# Patient Record
Sex: Female | Born: 1960 | Race: White | Hispanic: No | Marital: Married | Smoking: Never smoker
Health system: Southern US, Community
[De-identification: ages and names within clinical notes are randomized; demographics above are authoritative.]

## PROBLEM LIST (undated history)

## (undated) DIAGNOSIS — E079 Disorder of thyroid, unspecified: Secondary | ICD-10-CM

---

## 1998-01-17 ENCOUNTER — Ambulatory Visit (HOSPITAL_COMMUNITY): Admission: RE | Admit: 1998-01-17 | Discharge: 1998-01-17 | Payer: Self-pay | Admitting: Obstetrics and Gynecology

## 2001-11-26 ENCOUNTER — Other Ambulatory Visit: Admission: RE | Admit: 2001-11-26 | Discharge: 2001-11-26 | Payer: Self-pay | Admitting: *Deleted

## 2002-10-14 ENCOUNTER — Emergency Department (HOSPITAL_COMMUNITY): Admission: EM | Admit: 2002-10-14 | Discharge: 2002-10-14 | Payer: Self-pay | Admitting: Emergency Medicine

## 2002-10-14 ENCOUNTER — Encounter: Payer: Self-pay | Admitting: Emergency Medicine

## 2003-08-20 ENCOUNTER — Other Ambulatory Visit: Admission: RE | Admit: 2003-08-20 | Discharge: 2003-08-20 | Payer: Self-pay | Admitting: *Deleted

## 2004-10-09 ENCOUNTER — Emergency Department (HOSPITAL_COMMUNITY): Admission: EM | Admit: 2004-10-09 | Discharge: 2004-10-09 | Payer: Self-pay | Admitting: Emergency Medicine

## 2004-12-20 ENCOUNTER — Ambulatory Visit: Payer: Self-pay

## 2006-10-05 ENCOUNTER — Encounter: Admission: RE | Admit: 2006-10-05 | Discharge: 2006-10-05 | Payer: Self-pay | Admitting: Obstetrics and Gynecology

## 2010-07-09 ENCOUNTER — Encounter: Admission: RE | Admit: 2010-07-09 | Discharge: 2010-07-09 | Payer: Self-pay | Admitting: Obstetrics and Gynecology

## 2011-06-24 ENCOUNTER — Other Ambulatory Visit (HOSPITAL_COMMUNITY): Payer: Self-pay | Admitting: Obstetrics and Gynecology

## 2011-06-24 ENCOUNTER — Ambulatory Visit (HOSPITAL_COMMUNITY)
Admission: RE | Admit: 2011-06-24 | Discharge: 2011-06-24 | Disposition: A | Discharge status: Home / Self Care | Payer: BC Managed Care – PPO | Source: Ambulatory Visit | Attending: Obstetrics and Gynecology | Admitting: Obstetrics and Gynecology

## 2011-06-24 DIAGNOSIS — N83209 Unspecified ovarian cyst, unspecified side: Secondary | ICD-10-CM | POA: Insufficient documentation

## 2011-06-24 DIAGNOSIS — R1032 Left lower quadrant pain: Secondary | ICD-10-CM

## 2011-06-24 DIAGNOSIS — R109 Unspecified abdominal pain: Secondary | ICD-10-CM | POA: Insufficient documentation

## 2012-05-27 ENCOUNTER — Ambulatory Visit: Payer: BC Managed Care – PPO

## 2012-05-27 ENCOUNTER — Ambulatory Visit (INDEPENDENT_AMBULATORY_CARE_PROVIDER_SITE_OTHER): Payer: BC Managed Care – PPO | Admitting: Family Medicine

## 2012-05-27 VITALS — BP 152/92 | HR 96 | Temp 98.8°F | Resp 16 | Ht 64.0 in | Wt 207.0 lb

## 2012-05-27 DIAGNOSIS — S39012A Strain of muscle, fascia and tendon of lower back, initial encounter: Secondary | ICD-10-CM

## 2012-05-27 DIAGNOSIS — M549 Dorsalgia, unspecified: Secondary | ICD-10-CM

## 2012-05-27 DIAGNOSIS — R8281 Pyuria: Secondary | ICD-10-CM

## 2012-05-27 DIAGNOSIS — IMO0002 Reserved for concepts with insufficient information to code with codable children: Secondary | ICD-10-CM

## 2012-05-27 DIAGNOSIS — R82998 Other abnormal findings in urine: Secondary | ICD-10-CM

## 2012-05-27 DIAGNOSIS — M62838 Other muscle spasm: Secondary | ICD-10-CM

## 2012-05-27 LAB — POCT URINALYSIS DIPSTICK
Bilirubin, UA: NEGATIVE
Glucose, UA: NEGATIVE
Ketones, UA: NEGATIVE
Nitrite, UA: NEGATIVE
pH, UA: 6

## 2012-05-27 LAB — POCT UA - MICROSCOPIC ONLY
RBC, urine, microscopic: NEGATIVE
Yeast, UA: NEGATIVE

## 2012-05-27 MED ORDER — CYCLOBENZAPRINE HCL 5 MG PO TABS: ORAL_TABLET | ORAL | Status: AC

## 2012-05-27 MED ORDER — MELOXICAM 7.5 MG PO TABS: 7.5000 mg | ORAL_TABLET | Freq: Every day | ORAL | Status: AC

## 2012-05-27 MED ORDER — HYDROCODONE-ACETAMINOPHEN 5-325 MG PO TABS: 1.0000 | ORAL_TABLET | Freq: Four times a day (QID) | ORAL | Status: AC | PRN

## 2012-05-27 NOTE — Patient Instructions (Signed)
If not improving in next 7-10 days - recheck. Return to the clinic or go to the nearest emergency room if any of your symptoms worsen or new symptoms occur. Your should receive a call or letter about your lab results within the next week to 10 days.
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
## 2012-05-29 LAB — URINE CULTURE: Colony Count: 40000

## 2012-07-17 ENCOUNTER — Ambulatory Visit (INDEPENDENT_AMBULATORY_CARE_PROVIDER_SITE_OTHER): Payer: BC Managed Care – PPO | Admitting: Emergency Medicine

## 2012-07-17 VITALS — BP 130/75 | HR 86 | Temp 99.3°F | Resp 18 | Ht 64.25 in | Wt 209.0 lb

## 2012-07-17 DIAGNOSIS — H6590 Unspecified nonsuppurative otitis media, unspecified ear: Secondary | ICD-10-CM

## 2012-07-17 MED ORDER — ANTIPYRINE-BENZOCAINE 54-14 MG/ML OT SOLN: 2.0000 [drp] | Freq: Four times a day (QID) | OTIC | Status: DC | PRN

## 2012-07-17 MED ORDER — PSEUDOEPHEDRINE-GUAIFENESIN ER 60-600 MG PO TB12: 1.0000 | ORAL_TABLET | Freq: Two times a day (BID) | ORAL | Status: AC

## 2012-07-17 NOTE — Progress Notes (Signed)
   Date:  07/17/2012   Name:  Linda Pittman   DOB:  25-Nov-1960   MRN:  [id number] Gender: female Age: 51 y.o.  PCP:  No primary provider on file.    Chief Complaint: Otalgia   History of Present Illness:  Linda Pittman is a 51 y.o. pleasant patient who presents with the following:  Ill since last week with pain in left ear. Now has difficulty sleeping due to the pain.  No fever or chills.  Has nasal congestion but no drainage. Has pain in teeth on the left side.  No cough or other complaints.  No improvement with OTC meds.  Preschool teacher.  There is no problem list on file for this patient.   No past medical history on file.  No past surgical history on file.  History  Substance Use Topics  . Smoking status: Never Smoker   . Smokeless tobacco: Not on file  . Alcohol Use: Not on file    No family history on file.  Allergies  Allergen Reactions  . Penicillins Rash    Medication list has been reviewed and updated.  Outpatient Prescriptions Prior to Visit  Medication Sig Dispense Refill  . levothyroxine (SYNTHROID, LEVOTHROID) 200 MCG tablet Take 200 mcg by mouth daily.      . rosuvastatin (CRESTOR) 20 MG tablet Take 20 mg by mouth daily.      . meloxicam (MOBIC) 7.5 MG tablet Take 1 tablet (7.5 mg total) by mouth daily.  30 tablet  0    Review of Systems:  As per HPI, otherwise negative.    Physical Examination: Filed Vitals:   07/17/12 1640  BP: 130/75  Pulse: 86  Temp: 99.3 F (37.4 C)  Resp: 18   Filed Vitals:   07/17/12 1640  Height: 5' 4.25" (1.632 m)  Weight: 209 lb (94.802 kg)   Body mass index is 35.60 kg/(m^2). Ideal Body Weight: Weight in (lb) to have BMI = 25: 146.5   GEN: WDWN, NAD, Non-toxic, A & O x 3 HEENT: Atraumatic, Normocephalic. Neck supple. No masses, No LAD.  Oropharynx negative.  No sinus tenderness Ears and Nose: No external deformity.  TM's normal color left retracted. CV: RRR, No M/G/R. No JVD. No thrill. No  extra heart sounds. PULM: CTA B, no wheezes, crackles, rhonchi. No retractions. No resp. distress. No accessory muscle use. NEURO Normal gait.  PSYCH: Normally interactive. Conversant. Not depressed or anxious appearing.  Calm demeanor.    Assessment and Plan: Serous otitis media Eustachian tube dysfunction. mucinex d Follow up as needed Dot Lanes, Tessa Lerner, MD I have reviewed and agree with documentation. Robert P. Merla Riches, M.D.

## 2012-10-10 ENCOUNTER — Other Ambulatory Visit: Payer: Self-pay | Admitting: Obstetrics and Gynecology

## 2012-10-10 DIAGNOSIS — Z1231 Encounter for screening mammogram for malignant neoplasm of breast: Secondary | ICD-10-CM

## 2012-11-09 ENCOUNTER — Ambulatory Visit
Admission: RE | Admit: 2012-11-09 | Discharge: 2012-11-09 | Disposition: A | Discharge status: Home / Self Care | Payer: BC Managed Care – PPO | Source: Ambulatory Visit | Attending: Obstetrics and Gynecology | Admitting: Obstetrics and Gynecology

## 2012-11-09 DIAGNOSIS — Z1231 Encounter for screening mammogram for malignant neoplasm of breast: Secondary | ICD-10-CM

## 2012-11-15 ENCOUNTER — Other Ambulatory Visit: Payer: Self-pay | Admitting: Obstetrics and Gynecology

## 2012-11-15 DIAGNOSIS — R928 Other abnormal and inconclusive findings on diagnostic imaging of breast: Secondary | ICD-10-CM

## 2012-11-26 ENCOUNTER — Ambulatory Visit
Admission: RE | Admit: 2012-11-26 | Discharge: 2012-11-26 | Disposition: A | Discharge status: Home / Self Care | Payer: BC Managed Care – PPO | Source: Ambulatory Visit | Attending: Obstetrics and Gynecology | Admitting: Obstetrics and Gynecology

## 2012-11-26 DIAGNOSIS — R928 Other abnormal and inconclusive findings on diagnostic imaging of breast: Secondary | ICD-10-CM

## 2013-04-22 ENCOUNTER — Other Ambulatory Visit: Payer: Self-pay | Admitting: Obstetrics and Gynecology

## 2013-04-22 DIAGNOSIS — R921 Mammographic calcification found on diagnostic imaging of breast: Secondary | ICD-10-CM

## 2013-06-10 ENCOUNTER — Ambulatory Visit
Admission: RE | Admit: 2013-06-10 | Discharge: 2013-06-10 | Disposition: A | Discharge status: Home / Self Care | Payer: BC Managed Care – PPO | Source: Ambulatory Visit | Attending: Obstetrics and Gynecology | Admitting: Obstetrics and Gynecology

## 2013-06-10 DIAGNOSIS — R921 Mammographic calcification found on diagnostic imaging of breast: Secondary | ICD-10-CM

## 2013-11-27 ENCOUNTER — Other Ambulatory Visit: Payer: Self-pay | Admitting: Obstetrics and Gynecology

## 2013-11-27 DIAGNOSIS — R921 Mammographic calcification found on diagnostic imaging of breast: Secondary | ICD-10-CM

## 2013-12-12 ENCOUNTER — Ambulatory Visit
Admission: RE | Admit: 2013-12-12 | Discharge: 2013-12-12 | Disposition: A | Discharge status: Home / Self Care | Payer: Self-pay | Source: Ambulatory Visit | Attending: Obstetrics and Gynecology | Admitting: Obstetrics and Gynecology

## 2013-12-12 DIAGNOSIS — R921 Mammographic calcification found on diagnostic imaging of breast: Secondary | ICD-10-CM

## 2014-12-05 ENCOUNTER — Other Ambulatory Visit: Payer: Self-pay

## 2014-12-05 DIAGNOSIS — Z1231 Encounter for screening mammogram for malignant neoplasm of breast: Secondary | ICD-10-CM

## 2014-12-15 ENCOUNTER — Ambulatory Visit: Payer: Self-pay

## 2015-01-05 ENCOUNTER — Ambulatory Visit
Admission: RE | Admit: 2015-01-05 | Discharge: 2015-01-05 | Disposition: A | Discharge status: Home / Self Care | Payer: BLUE CROSS/BLUE SHIELD | Source: Ambulatory Visit

## 2015-01-05 DIAGNOSIS — Z1231 Encounter for screening mammogram for malignant neoplasm of breast: Secondary | ICD-10-CM

## 2016-03-16 ENCOUNTER — Other Ambulatory Visit: Payer: Self-pay

## 2016-03-16 DIAGNOSIS — Z1231 Encounter for screening mammogram for malignant neoplasm of breast: Secondary | ICD-10-CM

## 2016-03-25 ENCOUNTER — Ambulatory Visit: Payer: BLUE CROSS/BLUE SHIELD

## 2016-04-08 ENCOUNTER — Ambulatory Visit: Payer: BLUE CROSS/BLUE SHIELD

## 2016-04-14 ENCOUNTER — Ambulatory Visit
Admission: RE | Admit: 2016-04-14 | Discharge: 2016-04-14 | Disposition: A | Discharge status: Home / Self Care | Payer: BLUE CROSS/BLUE SHIELD | Source: Ambulatory Visit

## 2016-04-14 DIAGNOSIS — Z1231 Encounter for screening mammogram for malignant neoplasm of breast: Secondary | ICD-10-CM

## 2016-04-25 DIAGNOSIS — E784 Other hyperlipidemia: Secondary | ICD-10-CM | POA: Diagnosis not present

## 2016-04-25 DIAGNOSIS — Z1389 Encounter for screening for other disorder: Secondary | ICD-10-CM | POA: Diagnosis not present

## 2016-04-25 DIAGNOSIS — E038 Other specified hypothyroidism: Secondary | ICD-10-CM | POA: Diagnosis not present

## 2016-04-25 DIAGNOSIS — K219 Gastro-esophageal reflux disease without esophagitis: Secondary | ICD-10-CM | POA: Diagnosis not present

## 2016-04-25 DIAGNOSIS — R7302 Impaired glucose tolerance (oral): Secondary | ICD-10-CM | POA: Diagnosis not present

## 2016-07-25 ENCOUNTER — Emergency Department (HOSPITAL_COMMUNITY)
Admission: EM | Admit: 2016-07-25 | Discharge: 2016-07-25 | Disposition: A | Discharge status: Home / Self Care | Payer: BLUE CROSS/BLUE SHIELD | Attending: Emergency Medicine | Admitting: Emergency Medicine

## 2016-07-25 ENCOUNTER — Encounter (HOSPITAL_COMMUNITY): Payer: Self-pay | Admitting: Emergency Medicine

## 2016-07-25 ENCOUNTER — Emergency Department (HOSPITAL_COMMUNITY): Payer: BLUE CROSS/BLUE SHIELD

## 2016-07-25 DIAGNOSIS — R079 Chest pain, unspecified: Secondary | ICD-10-CM | POA: Insufficient documentation

## 2016-07-25 DIAGNOSIS — Z79899 Other long term (current) drug therapy: Secondary | ICD-10-CM | POA: Insufficient documentation

## 2016-07-25 DIAGNOSIS — R0789 Other chest pain: Secondary | ICD-10-CM | POA: Diagnosis not present

## 2016-07-25 HISTORY — DX: Disorder of thyroid, unspecified: E07.9

## 2016-07-25 LAB — I-STAT TROPONIN, ED
TROPONIN I, POC: 0 ng/mL (ref 0.00–0.08)
Troponin i, poc: 0 ng/mL (ref 0.00–0.08)

## 2016-07-25 LAB — CBC
HCT: 36.4 % (ref 36.0–46.0)
Hemoglobin: 12.3 g/dL (ref 12.0–15.0)
MCH: 31.1 pg (ref 26.0–34.0)
MCHC: 33.8 g/dL (ref 30.0–36.0)
MCV: 92.2 fL (ref 78.0–100.0)
PLATELETS: 229 10*3/uL (ref 150–400)
RBC: 3.95 MIL/uL (ref 3.87–5.11)
RDW: 12.8 % (ref 11.5–15.5)
WBC: 6.3 10*3/uL (ref 4.0–10.5)

## 2016-07-25 LAB — BASIC METABOLIC PANEL
Anion gap: 10 (ref 5–15)
BUN: 12 mg/dL (ref 6–20)
CHLORIDE: 106 mmol/L (ref 101–111)
CO2: 24 mmol/L (ref 22–32)
CREATININE: 0.64 mg/dL (ref 0.44–1.00)
Calcium: 9.2 mg/dL (ref 8.9–10.3)
GFR calc Af Amer: >60 mL/min (ref >60)
GFR calc non Af Amer: >60 mL/min (ref >60)
Glucose, Bld: 157 mg/dL — ABNORMAL HIGH (ref 65–99)
Potassium: 3.3 mmol/L — ABNORMAL LOW (ref 3.5–5.1)
SODIUM: 140 mmol/L (ref 135–145)

## 2016-07-25 LAB — D-DIMER, QUANTITATIVE (NOT AT ARMC): D-Dimer, Quant: <0.27 ug/mL-FEU (ref 0.00–0.50)

## 2016-07-25 LAB — CBG MONITORING, ED: GLUCOSE-CAPILLARY: 142 mg/dL — AB (ref 65–99)

## 2016-07-25 MED ORDER — ASPIRIN 81 MG PO CHEW
324.0000 mg | CHEWABLE_TABLET | Freq: Once | ORAL | Status: AC
  Administered 2016-07-25: 324 mg via ORAL
  Filled 2016-07-25: qty 4

## 2016-07-25 MED ORDER — CYCLOBENZAPRINE HCL 5 MG PO TABS: 5.0000 mg | ORAL_TABLET | Freq: Two times a day (BID) | ORAL | 0 refills | Status: AC | PRN

## 2016-07-25 MED ORDER — POTASSIUM CHLORIDE CRYS ER 20 MEQ PO TBCR
20.0000 meq | EXTENDED_RELEASE_TABLET | Freq: Once | ORAL | Status: AC
  Administered 2016-07-25: 20 meq via ORAL
  Filled 2016-07-25: qty 1

## 2016-07-25 NOTE — ED Provider Notes (Signed)
[facility]-EMERGENCY DEPT Provider Note   CSN: [id number] Arrival date & time: 07/25/16  1347     History   Chief Complaint Chief Complaint  Patient presents with  . Chest Pain  . Arm Pain    HPI Linda Pittman is a 55 y.o. female with a past medical history significant for thyroid disease who presents with chest pain. Patient reports that she had one episode of chest pain last week that she thought was related to chronic left shoulder muscle pain. She says that it went away after one day last week and ibuprofen use. She says that today, while driving her car, she had sudden onset left-sided chest pain. She says that she also hurt in her left shoulder the same time. She denied diaphoresis, but does report onset of nausea. She also felt lightheaded and fatigued to the point where she pulled over the car to get help. She reports that the chest pain or lasted for several seconds and it was stabbing in nature. She denies any recent fevers, chills, constipation, diarrhea or dysuria.  Of note, patient reports that she is a never smoker but does have a strong family history of heart disease in both parents. She reports her father had a heart attack in his 30s. He denies a history of DVT/PE and denies any recent leg pain or leg swelling.   The history is provided by the patient, the spouse and medical records. No language interpreter was used.  Chest Pain   This is a new problem. The current episode started 1 to 2 hours ago. The problem occurs rarely. The problem has been resolved. The pain is associated with rest. The pain is present in the lateral region. The pain is at a severity of 9/10. The pain is severe. The quality of the pain is described as brief and sharp. The pain radiates to the left shoulder. Duration of episode(s) is 2 seconds. Associated symptoms include nausea. Pertinent negatives include no abdominal pain, no back pain, no claudication, no cough, no diaphoresis, no exertional chest  pressure, no fever, no headaches, no hemoptysis, no irregular heartbeat, no leg pain, no lower extremity edema, no numbness, no palpitations, no shortness of breath, no syncope, no vomiting and no weakness. She has tried nothing for the symptoms. The treatment provided no relief.  Pertinent negatives for past medical history include no CAD, no diabetes, no pacemaker, no PE and no recent injury.    Past Medical History:  Diagnosis Date  . Thyroid disease     There are no active problems to display for this patient.   History reviewed. No pertinent surgical history.  OB History    No data available       Home Medications    Prior to Admission medications   Medication Sig Start Date End Date Taking? Authorizing Provider  ibuprofen (ADVIL,MOTRIN) 200 MG tablet Take 200 mg by mouth every 6 (six) hours as needed for fever, headache, mild pain, moderate pain or cramping.   Yes Historical Provider, MD  levothyroxine (SYNTHROID, LEVOTHROID) 175 MCG tablet Take 175 mcg by mouth daily before breakfast.   Yes Historical Provider, MD  rosuvastatin (CRESTOR) 20 MG tablet Take 20 mg by mouth every Sunday.    Yes Historical Provider, MD    Family History No family history on file.  Social History Social History  Substance Use Topics  . Smoking status: Never Smoker  . Smokeless tobacco: Not on file  . Alcohol use Yes  Comment: occasional      Allergies   Penicillins   Review of Systems Review of Systems  Constitutional: Negative for chills, diaphoresis, fatigue and fever.  HENT: Negative for congestion and rhinorrhea.   Respiratory: Negative for cough, hemoptysis, chest tightness, shortness of breath, wheezing and stridor.   Cardiovascular: Positive for chest pain. Negative for palpitations, claudication and syncope.  Gastrointestinal: Positive for nausea. Negative for abdominal pain, constipation, diarrhea and vomiting.  Genitourinary: Negative for dysuria.  Musculoskeletal:  Negative for back pain, neck pain and neck stiffness.  Skin: Negative for rash and wound.  Neurological: Negative for facial asymmetry, weakness, numbness and headaches.  Psychiatric/Behavioral: Negative for confusion.  All other systems reviewed and are negative.    Physical Exam Updated Vital Signs BP 135/82   Pulse 64   Temp 98.4 F (36.9 C) (Oral)   Resp 13   Ht 5' 4.25" (1.632 m)   Wt 195 lb (88.5 kg)   LMP 06/30/2016   SpO2 95%   BMI 33.21 kg/m   Physical Exam  Constitutional: She appears well-developed and well-nourished. No distress.  HENT:  Head: Normocephalic and atraumatic.  Nose: Nose normal.  Mouth/Throat: Oropharynx is clear and moist. No oropharyngeal exudate.  Eyes: Conjunctivae and EOM are normal. Pupils are equal, round, and reactive to light.  Neck: Normal range of motion. Neck supple.  Cardiovascular: Normal rate and regular rhythm.   No murmur heard. Pulmonary/Chest: Effort normal and breath sounds normal. No respiratory distress. She has no wheezes. She has no rales. She exhibits tenderness.    Abdominal: Soft. There is no tenderness.  Musculoskeletal: She exhibits no edema or tenderness.  Neurological: She is alert. She exhibits normal muscle tone.  Skin: Skin is warm and dry.  Psychiatric: She has a normal mood and affect.  Nursing note and vitals reviewed.    ED Treatments / Results  Labs (all labs ordered are listed, but only abnormal results are displayed) Labs Reviewed  BASIC METABOLIC PANEL - Abnormal; Notable for the following:       Result Value   Potassium 3.3 (*)    Glucose, Bld 157 (*)    All other components within normal limits  CBG MONITORING, ED - Abnormal; Notable for the following:    Glucose-Capillary 142 (*)    All other components within normal limits  CBC  D-DIMER, QUANTITATIVE (NOT [facility])  Rosezena SensorI-STAT TROPOININ, ED  I-STAT TROPOININ, ED    EKG  EKG Interpretation  Date/Time:  Monday July 25 2016 13:55:19  EDT Ventricular Rate:  77 PR Interval:    QRS Duration: 100 QT Interval:  398 QTC Calculation: 451 R Axis:   48 Text Interpretation:  Sinus rhythm Low voltage, precordial leads Baseline wander in lead(s) V1 NO prior EKG present. No evideice of Ischemia.  Confirmed by Rush LandmarkEGELER MD, Howell Groesbeck [phone]) on 07/25/2016 4:42:21 PM       Radiology Dg Chest 2 View  Result Date: 07/25/2016 CLINICAL DATA:  Patient with sharp shooting left-sided chest pain radiating to the left arm. Initial encounter. EXAM: CHEST  2 VIEW COMPARISON:  None. FINDINGS: Monitoring leads overlie the patient. Normal cardiac and mediastinal contours. Minimal heterogeneous opacities left lower lung. No pleural effusion or pneumothorax. IMPRESSION: Probable atelectasis left lower lung. Electronically Signed   By: Annia Beltrew  Davis M.D.   On: 07/25/2016 15:02    Procedures Procedures (including critical care time)  Medications Ordered in ED Medications  aspirin chewable tablet 324 mg (324 mg Oral Given 07/25/16 1654)  potassium chloride SA (K-DUR,KLOR-CON) CR tablet 20 mEq (20 mEq Oral Given 07/25/16 1758)     Initial Impression / Assessment and Plan / ED Course  I have reviewed the triage vital signs and the nursing notes.  Pertinent labs & imaging results that were available during my care of the patient were reviewed by me and considered in my medical decision making (see chart for details).  Clinical Course   Linda Pittman is a 55 y.o. female with a past medical history significant for thyroid disease who presents with chest pain. History and exam are seen above. Patients EKG showed no acute ischemia. HEAR score calculated as a two. Based on description of symptoms, suspect musculoskeletal chest pain. Patient given Aspirin during workup.  Results Of the workup are seen above. Given patient's age, she was not PERCable. D dimer negative. Troponin negative times two. Potassium low at 3.3, this was supplemented. No evidence  of infection on laboratory testing, and electrolytes not otherwise abnormal. Chest x-ray showed atelectasis but no concern for pneumonia or other significant abnormality.  Patient did not have any other pain while in the emergency department. Suspect patient's ongoing left shoulder and left musculoskeletal pain contributed to her left chest wall pain today. Similar chest discomfort was reproduced with strong palpation of chest wall.. Patient given instructions to follow up with PCP for further management. Patient requested muscle relaxant due to concern for muscle spasm in shoulder. This was provided. Patient given strict return precautions were any return of symptoms. Patient understood this and plans to follow. Patient had no other questions or concerns and patient discharged in good condition.    Final Clinical Impressions(s) / ED Diagnoses   Final diagnoses:  Chest pain, unspecified chest pain type    New Prescriptions Discharge Medication List as of 07/25/2016  6:34 PM    START taking these medications   Details  cyclobenzaprine (FLEXERIL) 5 MG tablet Take 1 tablet (5 mg total) by mouth 2 (two) times daily as needed for muscle spasms., Starting Mon 07/25/2016, Print        Clinical Impression: 1. Chest pain, unspecified chest pain type     Disposition: Discharge  Condition: Good  I have discussed the results, Dx and Tx plan with the pt(& family if present). He/she/they expressed understanding and agree(s) with the plan. Discharge instructions discussed at great length. Strict return precautions discussed and pt &/or family have verbalized understanding of the instructions. No further questions at time of discharge.    Discharge Medication List as of 07/25/2016  6:34 PM    START taking these medications   Details  cyclobenzaprine (FLEXERIL) 5 MG tablet Take 1 tablet (5 mg total) by mouth 2 (two) times daily as needed for muscle spasms., Starting Mon 07/25/2016, Print         Follow Up: Adrian Prince, MD [address] [place] [address] [phone]  Schedule an appointment as soon as possible for a visit  If symptoms worsen, please return to the nearest ED.     Canary Brim Robyn Galati, MD 07/26/16 [phone]

## 2016-07-25 NOTE — Progress Notes (Signed)
Patient confirms her pcp is Dr. Adrian PrinceStephen South.  System updated.

## 2016-07-25 NOTE — ED Notes (Signed)
Patient transported to X-ray 

## 2016-07-25 NOTE — ED Notes (Signed)
Discharge instructions, follow up care, and rx x1 reviewed with patient. Patient verbalized understanding. 
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
## 2017-01-20 DIAGNOSIS — H524 Presbyopia: Secondary | ICD-10-CM | POA: Diagnosis not present

## 2017-01-20 DIAGNOSIS — H5203 Hypermetropia, bilateral: Secondary | ICD-10-CM | POA: Diagnosis not present

## 2017-04-25 DIAGNOSIS — K219 Gastro-esophageal reflux disease without esophagitis: Secondary | ICD-10-CM | POA: Diagnosis not present

## 2017-04-25 DIAGNOSIS — E038 Other specified hypothyroidism: Secondary | ICD-10-CM | POA: Diagnosis not present

## 2017-04-25 DIAGNOSIS — E784 Other hyperlipidemia: Secondary | ICD-10-CM | POA: Diagnosis not present

## 2017-04-25 DIAGNOSIS — E119 Type 2 diabetes mellitus without complications: Secondary | ICD-10-CM | POA: Diagnosis not present

## 2017-04-25 DIAGNOSIS — Z1389 Encounter for screening for other disorder: Secondary | ICD-10-CM | POA: Diagnosis not present

## 2017-05-11 ENCOUNTER — Other Ambulatory Visit: Payer: Self-pay | Admitting: Obstetrics and Gynecology

## 2017-05-11 DIAGNOSIS — Z1231 Encounter for screening mammogram for malignant neoplasm of breast: Secondary | ICD-10-CM

## 2017-05-23 ENCOUNTER — Ambulatory Visit
Admission: RE | Admit: 2017-05-23 | Discharge: 2017-05-23 | Disposition: A | Discharge status: Home / Self Care | Payer: BLUE CROSS/BLUE SHIELD | Source: Ambulatory Visit | Attending: Obstetrics and Gynecology | Admitting: Obstetrics and Gynecology

## 2017-05-23 DIAGNOSIS — Z1231 Encounter for screening mammogram for malignant neoplasm of breast: Secondary | ICD-10-CM

## 2017-11-01 DIAGNOSIS — E7849 Other hyperlipidemia: Secondary | ICD-10-CM | POA: Diagnosis not present

## 2017-11-01 DIAGNOSIS — E119 Type 2 diabetes mellitus without complications: Secondary | ICD-10-CM | POA: Diagnosis not present

## 2017-11-01 DIAGNOSIS — E038 Other specified hypothyroidism: Secondary | ICD-10-CM | POA: Diagnosis not present

## 2017-11-01 DIAGNOSIS — Z1389 Encounter for screening for other disorder: Secondary | ICD-10-CM | POA: Diagnosis not present

## 2018-03-05 DIAGNOSIS — E119 Type 2 diabetes mellitus without complications: Secondary | ICD-10-CM | POA: Diagnosis not present

## 2018-03-05 DIAGNOSIS — E7849 Other hyperlipidemia: Secondary | ICD-10-CM | POA: Diagnosis not present

## 2018-03-05 DIAGNOSIS — E038 Other specified hypothyroidism: Secondary | ICD-10-CM | POA: Diagnosis not present

## 2018-03-05 DIAGNOSIS — Z1389 Encounter for screening for other disorder: Secondary | ICD-10-CM | POA: Diagnosis not present

## 2018-04-06 DIAGNOSIS — Z124 Encounter for screening for malignant neoplasm of cervix: Secondary | ICD-10-CM | POA: Diagnosis not present

## 2018-04-06 DIAGNOSIS — Z01419 Encounter for gynecological examination (general) (routine) without abnormal findings: Secondary | ICD-10-CM | POA: Diagnosis not present

## 2018-04-06 DIAGNOSIS — Z6833 Body mass index (BMI) 33.0-33.9, adult: Secondary | ICD-10-CM | POA: Diagnosis not present

## 2018-04-06 DIAGNOSIS — Z1231 Encounter for screening mammogram for malignant neoplasm of breast: Secondary | ICD-10-CM | POA: Diagnosis not present

## 2018-04-06 DIAGNOSIS — Z1389 Encounter for screening for other disorder: Secondary | ICD-10-CM | POA: Diagnosis not present

## 2018-06-09 IMAGING — CR DG CHEST 2V
2 series · 2 of 2 positions shown · non-contrast
Comparison: None.

CLINICAL DATA: Patient with sharp shooting left-sided chest pain
radiating to the left arm. Initial encounter.

EXAM:
CHEST  2 VIEW

[w chest pa]
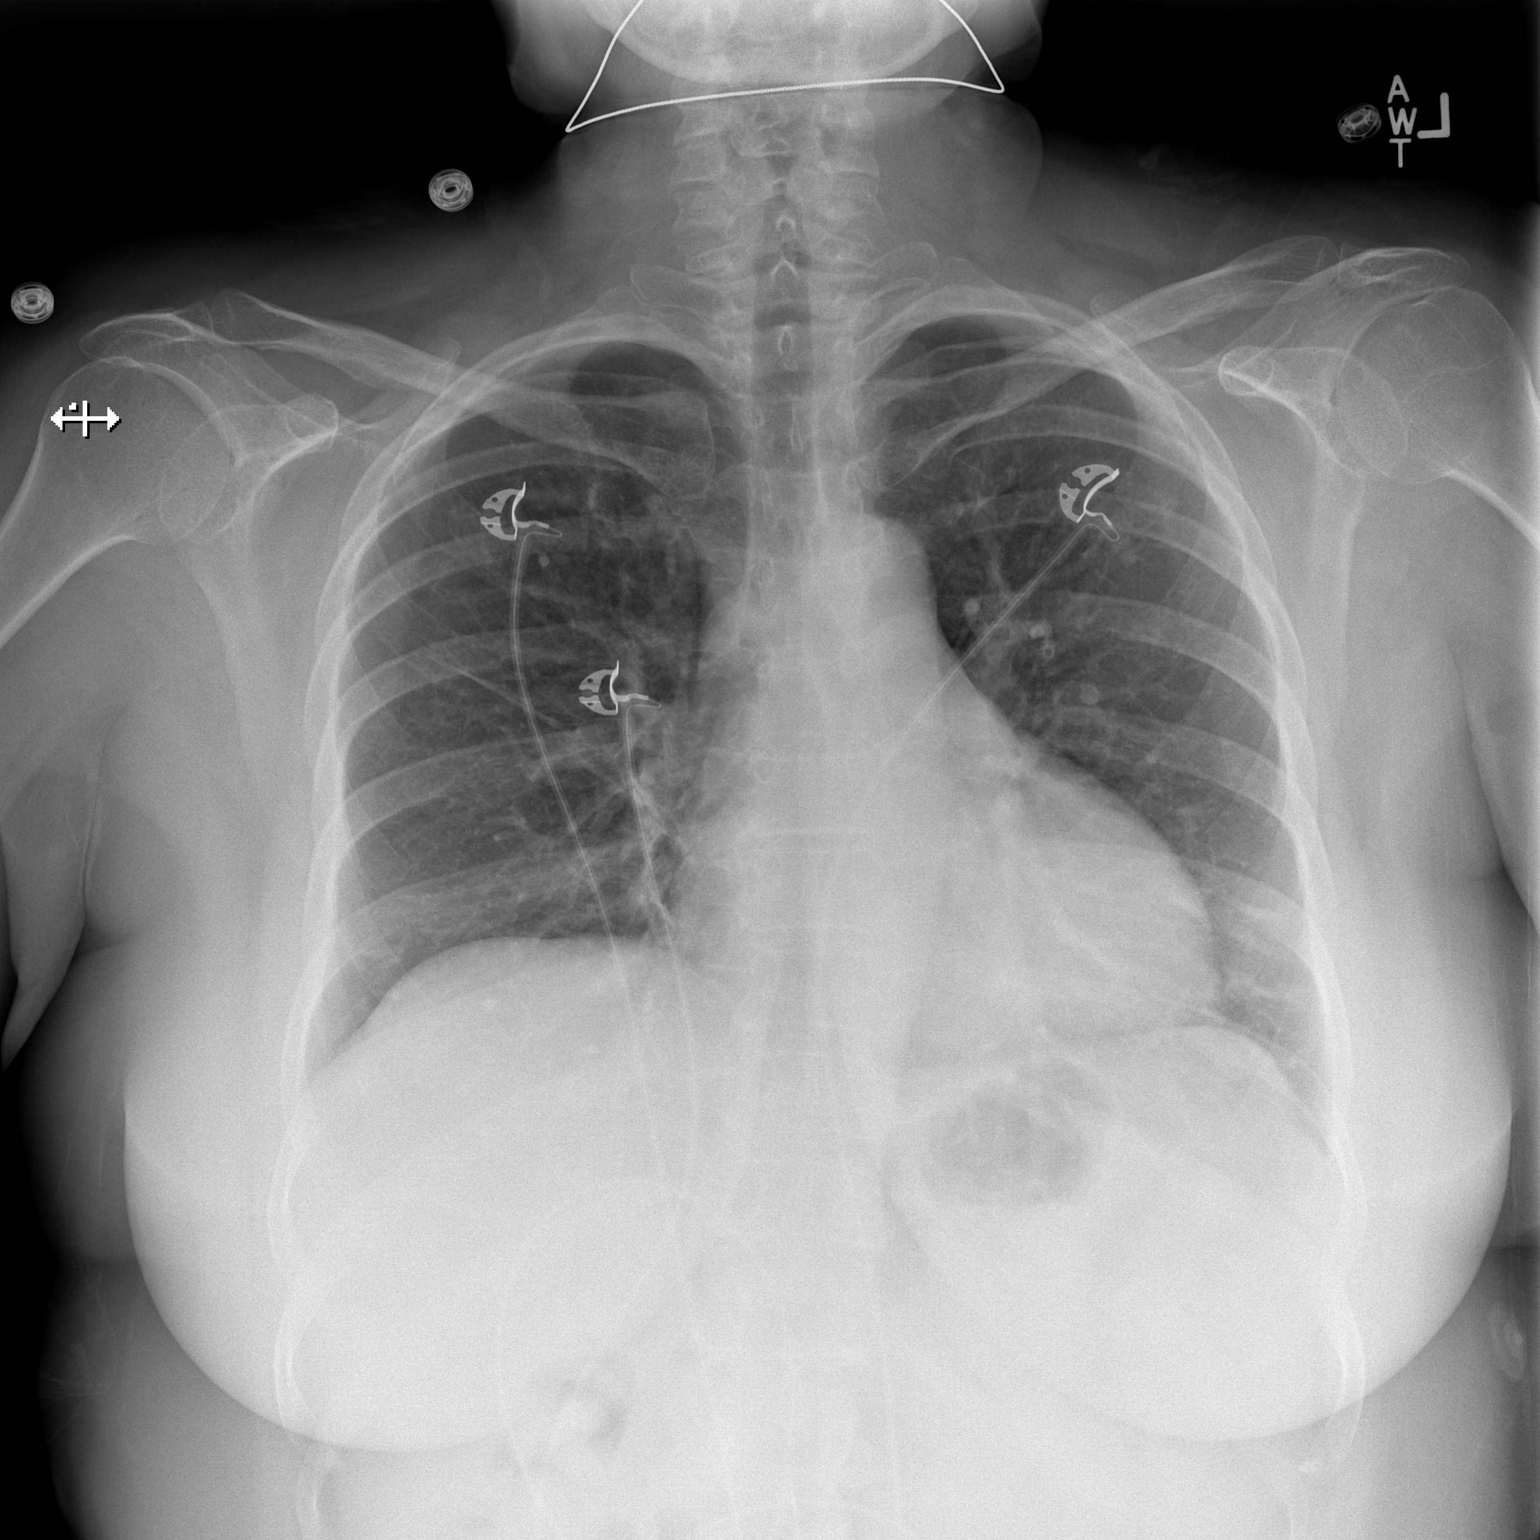

[w chest lat]
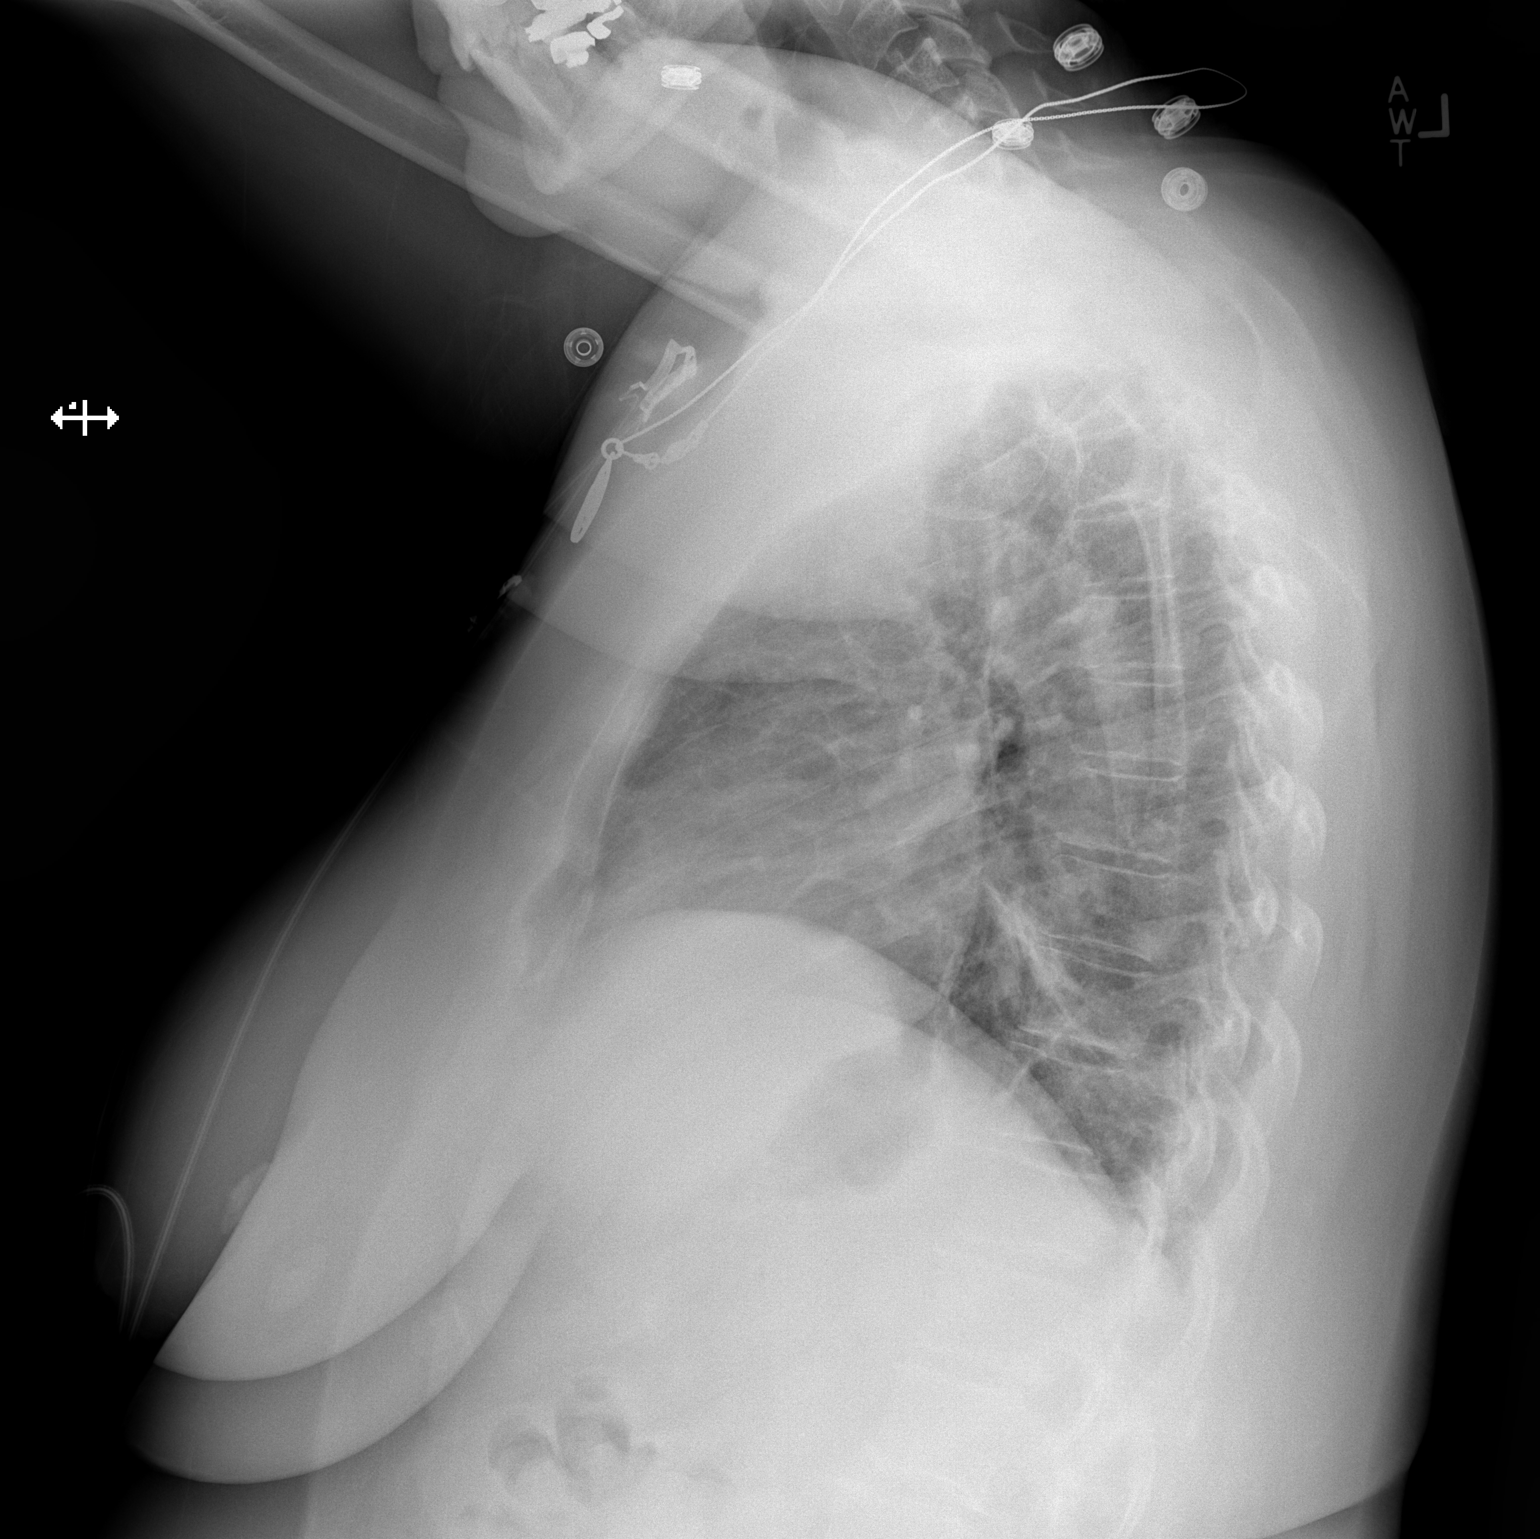

[2 of 2 positions shown; findings below may reference images not displayed]

FINDINGS: Monitoring leads overlie the patient. Normal cardiac and mediastinal
contours. Minimal heterogeneous opacities left lower lung. No
pleural effusion or pneumothorax.
IMPRESSION: Probable atelectasis left lower lung.

## 2018-07-10 DIAGNOSIS — E1169 Type 2 diabetes mellitus with other specified complication: Secondary | ICD-10-CM | POA: Diagnosis not present

## 2018-07-10 DIAGNOSIS — E7849 Other hyperlipidemia: Secondary | ICD-10-CM | POA: Diagnosis not present

## 2018-07-10 DIAGNOSIS — E038 Other specified hypothyroidism: Secondary | ICD-10-CM | POA: Diagnosis not present

## 2018-07-10 DIAGNOSIS — K219 Gastro-esophageal reflux disease without esophagitis: Secondary | ICD-10-CM | POA: Diagnosis not present

## 2018-07-28 DIAGNOSIS — Z23 Encounter for immunization: Secondary | ICD-10-CM | POA: Diagnosis not present

## 2018-08-15 DIAGNOSIS — H5203 Hypermetropia, bilateral: Secondary | ICD-10-CM | POA: Diagnosis not present

## 2018-08-15 DIAGNOSIS — H524 Presbyopia: Secondary | ICD-10-CM | POA: Diagnosis not present

## 2018-08-15 DIAGNOSIS — H04123 Dry eye syndrome of bilateral lacrimal glands: Secondary | ICD-10-CM | POA: Diagnosis not present

## 2018-09-25 DIAGNOSIS — E038 Other specified hypothyroidism: Secondary | ICD-10-CM | POA: Diagnosis not present

## 2018-11-27 DIAGNOSIS — E119 Type 2 diabetes mellitus without complications: Secondary | ICD-10-CM | POA: Diagnosis not present

## 2018-11-27 DIAGNOSIS — E038 Other specified hypothyroidism: Secondary | ICD-10-CM | POA: Diagnosis not present

## 2018-11-27 DIAGNOSIS — E7849 Other hyperlipidemia: Secondary | ICD-10-CM | POA: Diagnosis not present

## 2018-11-27 DIAGNOSIS — E1169 Type 2 diabetes mellitus with other specified complication: Secondary | ICD-10-CM | POA: Diagnosis not present

## 2018-11-27 DIAGNOSIS — K219 Gastro-esophageal reflux disease without esophagitis: Secondary | ICD-10-CM | POA: Diagnosis not present

## 2019-04-15 DIAGNOSIS — Z1231 Encounter for screening mammogram for malignant neoplasm of breast: Secondary | ICD-10-CM | POA: Diagnosis not present

## 2019-04-23 DIAGNOSIS — E1169 Type 2 diabetes mellitus with other specified complication: Secondary | ICD-10-CM | POA: Diagnosis not present

## 2019-04-26 DIAGNOSIS — E1169 Type 2 diabetes mellitus with other specified complication: Secondary | ICD-10-CM | POA: Diagnosis not present

## 2019-04-26 DIAGNOSIS — E669 Obesity, unspecified: Secondary | ICD-10-CM | POA: Diagnosis not present

## 2019-04-26 DIAGNOSIS — E785 Hyperlipidemia, unspecified: Secondary | ICD-10-CM | POA: Diagnosis not present

## 2019-04-26 DIAGNOSIS — E039 Hypothyroidism, unspecified: Secondary | ICD-10-CM | POA: Diagnosis not present

## 2019-08-13 DIAGNOSIS — M79605 Pain in left leg: Secondary | ICD-10-CM | POA: Diagnosis not present

## 2019-08-13 DIAGNOSIS — S8002XA Contusion of left knee, initial encounter: Secondary | ICD-10-CM | POA: Diagnosis not present

## 2019-08-13 DIAGNOSIS — M545 Low back pain: Secondary | ICD-10-CM | POA: Diagnosis not present

## 2019-08-16 DIAGNOSIS — E669 Obesity, unspecified: Secondary | ICD-10-CM | POA: Diagnosis not present

## 2019-08-16 DIAGNOSIS — E785 Hyperlipidemia, unspecified: Secondary | ICD-10-CM | POA: Diagnosis not present

## 2019-08-16 DIAGNOSIS — E039 Hypothyroidism, unspecified: Secondary | ICD-10-CM | POA: Diagnosis not present

## 2019-08-16 DIAGNOSIS — E119 Type 2 diabetes mellitus without complications: Secondary | ICD-10-CM | POA: Diagnosis not present

## 2019-08-19 DIAGNOSIS — M79605 Pain in left leg: Secondary | ICD-10-CM | POA: Diagnosis not present

## 2019-08-19 DIAGNOSIS — M25562 Pain in left knee: Secondary | ICD-10-CM | POA: Diagnosis not present

## 2019-08-19 DIAGNOSIS — M25552 Pain in left hip: Secondary | ICD-10-CM | POA: Diagnosis not present

## 2019-08-20 DIAGNOSIS — E1169 Type 2 diabetes mellitus with other specified complication: Secondary | ICD-10-CM | POA: Diagnosis not present

## 2019-08-20 DIAGNOSIS — E038 Other specified hypothyroidism: Secondary | ICD-10-CM | POA: Diagnosis not present

## 2019-08-20 DIAGNOSIS — E7849 Other hyperlipidemia: Secondary | ICD-10-CM | POA: Diagnosis not present

## 2019-08-20 DIAGNOSIS — Z23 Encounter for immunization: Secondary | ICD-10-CM | POA: Diagnosis not present

## 2019-08-22 DIAGNOSIS — M79605 Pain in left leg: Secondary | ICD-10-CM | POA: Diagnosis not present

## 2019-08-22 DIAGNOSIS — M25552 Pain in left hip: Secondary | ICD-10-CM | POA: Diagnosis not present

## 2019-08-22 DIAGNOSIS — M25562 Pain in left knee: Secondary | ICD-10-CM | POA: Diagnosis not present

## 2019-08-27 DIAGNOSIS — M79605 Pain in left leg: Secondary | ICD-10-CM | POA: Diagnosis not present

## 2019-08-27 DIAGNOSIS — M25552 Pain in left hip: Secondary | ICD-10-CM | POA: Diagnosis not present

## 2019-08-27 DIAGNOSIS — M25562 Pain in left knee: Secondary | ICD-10-CM | POA: Diagnosis not present

## 2019-09-02 DIAGNOSIS — M25562 Pain in left knee: Secondary | ICD-10-CM | POA: Diagnosis not present

## 2019-09-02 DIAGNOSIS — M79605 Pain in left leg: Secondary | ICD-10-CM | POA: Diagnosis not present

## 2019-09-02 DIAGNOSIS — M25552 Pain in left hip: Secondary | ICD-10-CM | POA: Diagnosis not present

## 2019-09-10 DIAGNOSIS — S8002XA Contusion of left knee, initial encounter: Secondary | ICD-10-CM | POA: Diagnosis not present

## 2019-09-10 DIAGNOSIS — M79605 Pain in left leg: Secondary | ICD-10-CM | POA: Diagnosis not present

## 2019-09-10 DIAGNOSIS — M25562 Pain in left knee: Secondary | ICD-10-CM | POA: Diagnosis not present

## 2019-09-10 DIAGNOSIS — M545 Low back pain: Secondary | ICD-10-CM | POA: Diagnosis not present

## 2019-09-21 DIAGNOSIS — S8002XA Contusion of left knee, initial encounter: Secondary | ICD-10-CM | POA: Diagnosis not present

## 2019-09-24 DIAGNOSIS — H524 Presbyopia: Secondary | ICD-10-CM | POA: Diagnosis not present

## 2019-09-24 DIAGNOSIS — R7303 Prediabetes: Secondary | ICD-10-CM | POA: Diagnosis not present

## 2019-09-24 DIAGNOSIS — H2513 Age-related nuclear cataract, bilateral: Secondary | ICD-10-CM | POA: Diagnosis not present

## 2019-09-24 DIAGNOSIS — H5203 Hypermetropia, bilateral: Secondary | ICD-10-CM | POA: Diagnosis not present

## 2019-10-01 DIAGNOSIS — S8002XA Contusion of left knee, initial encounter: Secondary | ICD-10-CM | POA: Diagnosis not present

## 2019-10-07 DIAGNOSIS — M25552 Pain in left hip: Secondary | ICD-10-CM | POA: Diagnosis not present

## 2019-10-07 DIAGNOSIS — M79605 Pain in left leg: Secondary | ICD-10-CM | POA: Diagnosis not present

## 2019-10-07 DIAGNOSIS — M25562 Pain in left knee: Secondary | ICD-10-CM | POA: Diagnosis not present

## 2019-10-09 DIAGNOSIS — M25552 Pain in left hip: Secondary | ICD-10-CM | POA: Diagnosis not present

## 2019-10-09 DIAGNOSIS — M79605 Pain in left leg: Secondary | ICD-10-CM | POA: Diagnosis not present

## 2019-10-09 DIAGNOSIS — M25562 Pain in left knee: Secondary | ICD-10-CM | POA: Diagnosis not present

## 2019-10-14 DIAGNOSIS — M25562 Pain in left knee: Secondary | ICD-10-CM | POA: Diagnosis not present

## 2019-10-14 DIAGNOSIS — M25552 Pain in left hip: Secondary | ICD-10-CM | POA: Diagnosis not present

## 2019-10-14 DIAGNOSIS — M79605 Pain in left leg: Secondary | ICD-10-CM | POA: Diagnosis not present

## 2019-10-16 DIAGNOSIS — M25562 Pain in left knee: Secondary | ICD-10-CM | POA: Diagnosis not present

## 2019-10-16 DIAGNOSIS — M79605 Pain in left leg: Secondary | ICD-10-CM | POA: Diagnosis not present

## 2019-10-16 DIAGNOSIS — M25552 Pain in left hip: Secondary | ICD-10-CM | POA: Diagnosis not present

## 2019-10-17 DIAGNOSIS — M25562 Pain in left knee: Secondary | ICD-10-CM | POA: Diagnosis not present

## 2019-10-17 DIAGNOSIS — M25552 Pain in left hip: Secondary | ICD-10-CM | POA: Diagnosis not present

## 2019-10-22 DIAGNOSIS — M25562 Pain in left knee: Secondary | ICD-10-CM | POA: Diagnosis not present

## 2019-10-22 DIAGNOSIS — M79605 Pain in left leg: Secondary | ICD-10-CM | POA: Diagnosis not present

## 2019-10-22 DIAGNOSIS — M25552 Pain in left hip: Secondary | ICD-10-CM | POA: Diagnosis not present

## 2019-10-23 DIAGNOSIS — Z01419 Encounter for gynecological examination (general) (routine) without abnormal findings: Secondary | ICD-10-CM | POA: Diagnosis not present

## 2019-10-23 DIAGNOSIS — Z6833 Body mass index (BMI) 33.0-33.9, adult: Secondary | ICD-10-CM | POA: Diagnosis not present

## 2019-10-23 DIAGNOSIS — Z1389 Encounter for screening for other disorder: Secondary | ICD-10-CM | POA: Diagnosis not present

## 2019-10-28 DIAGNOSIS — M25552 Pain in left hip: Secondary | ICD-10-CM | POA: Diagnosis not present

## 2019-10-28 DIAGNOSIS — M25562 Pain in left knee: Secondary | ICD-10-CM | POA: Diagnosis not present

## 2019-10-28 DIAGNOSIS — M79605 Pain in left leg: Secondary | ICD-10-CM | POA: Diagnosis not present

## 2019-11-05 DIAGNOSIS — R030 Elevated blood-pressure reading, without diagnosis of hypertension: Secondary | ICD-10-CM | POA: Diagnosis not present

## 2019-11-06 DIAGNOSIS — E119 Type 2 diabetes mellitus without complications: Secondary | ICD-10-CM | POA: Diagnosis not present

## 2019-11-06 DIAGNOSIS — M25552 Pain in left hip: Secondary | ICD-10-CM | POA: Diagnosis not present

## 2019-11-06 DIAGNOSIS — E7849 Other hyperlipidemia: Secondary | ICD-10-CM | POA: Diagnosis not present

## 2019-11-06 DIAGNOSIS — M25562 Pain in left knee: Secondary | ICD-10-CM | POA: Diagnosis not present

## 2019-11-06 DIAGNOSIS — M79605 Pain in left leg: Secondary | ICD-10-CM | POA: Diagnosis not present

## 2019-11-07 DIAGNOSIS — E119 Type 2 diabetes mellitus without complications: Secondary | ICD-10-CM | POA: Diagnosis not present

## 2019-11-12 DIAGNOSIS — S8002XA Contusion of left knee, initial encounter: Secondary | ICD-10-CM | POA: Diagnosis not present

## 2019-11-17 DIAGNOSIS — M25552 Pain in left hip: Secondary | ICD-10-CM | POA: Diagnosis not present

## 2019-11-17 DIAGNOSIS — M25562 Pain in left knee: Secondary | ICD-10-CM | POA: Diagnosis not present

## 2019-11-18 DIAGNOSIS — M25552 Pain in left hip: Secondary | ICD-10-CM | POA: Diagnosis not present

## 2019-11-18 DIAGNOSIS — M79605 Pain in left leg: Secondary | ICD-10-CM | POA: Diagnosis not present

## 2019-11-18 DIAGNOSIS — M25562 Pain in left knee: Secondary | ICD-10-CM | POA: Diagnosis not present

## 2019-11-26 DIAGNOSIS — M545 Low back pain: Secondary | ICD-10-CM | POA: Diagnosis not present

## 2019-11-26 DIAGNOSIS — M25562 Pain in left knee: Secondary | ICD-10-CM | POA: Diagnosis not present

## 2019-12-03 DIAGNOSIS — M545 Low back pain: Secondary | ICD-10-CM | POA: Diagnosis not present

## 2019-12-10 DIAGNOSIS — M545 Low back pain: Secondary | ICD-10-CM | POA: Diagnosis not present

## 2019-12-10 DIAGNOSIS — M79605 Pain in left leg: Secondary | ICD-10-CM | POA: Diagnosis not present

## 2019-12-17 DIAGNOSIS — M5442 Lumbago with sciatica, left side: Secondary | ICD-10-CM | POA: Diagnosis not present

## 2019-12-17 DIAGNOSIS — M25552 Pain in left hip: Secondary | ICD-10-CM | POA: Diagnosis not present

## 2019-12-17 DIAGNOSIS — M25562 Pain in left knee: Secondary | ICD-10-CM | POA: Diagnosis not present

## 2019-12-17 DIAGNOSIS — M9904 Segmental and somatic dysfunction of sacral region: Secondary | ICD-10-CM | POA: Diagnosis not present

## 2019-12-17 DIAGNOSIS — M9903 Segmental and somatic dysfunction of lumbar region: Secondary | ICD-10-CM | POA: Diagnosis not present

## 2019-12-18 DIAGNOSIS — M5442 Lumbago with sciatica, left side: Secondary | ICD-10-CM | POA: Diagnosis not present

## 2019-12-18 DIAGNOSIS — M9903 Segmental and somatic dysfunction of lumbar region: Secondary | ICD-10-CM | POA: Diagnosis not present

## 2019-12-18 DIAGNOSIS — M25552 Pain in left hip: Secondary | ICD-10-CM | POA: Diagnosis not present

## 2019-12-18 DIAGNOSIS — M25562 Pain in left knee: Secondary | ICD-10-CM | POA: Diagnosis not present

## 2019-12-18 DIAGNOSIS — M9904 Segmental and somatic dysfunction of sacral region: Secondary | ICD-10-CM | POA: Diagnosis not present

## 2019-12-24 DIAGNOSIS — M5442 Lumbago with sciatica, left side: Secondary | ICD-10-CM | POA: Diagnosis not present

## 2019-12-24 DIAGNOSIS — M9904 Segmental and somatic dysfunction of sacral region: Secondary | ICD-10-CM | POA: Diagnosis not present

## 2019-12-24 DIAGNOSIS — M9903 Segmental and somatic dysfunction of lumbar region: Secondary | ICD-10-CM | POA: Diagnosis not present

## 2019-12-24 DIAGNOSIS — M25562 Pain in left knee: Secondary | ICD-10-CM | POA: Diagnosis not present

## 2019-12-26 DIAGNOSIS — M9903 Segmental and somatic dysfunction of lumbar region: Secondary | ICD-10-CM | POA: Diagnosis not present

## 2019-12-26 DIAGNOSIS — M9904 Segmental and somatic dysfunction of sacral region: Secondary | ICD-10-CM | POA: Diagnosis not present

## 2019-12-26 DIAGNOSIS — M25562 Pain in left knee: Secondary | ICD-10-CM | POA: Diagnosis not present

## 2019-12-26 DIAGNOSIS — M5442 Lumbago with sciatica, left side: Secondary | ICD-10-CM | POA: Diagnosis not present

## 2019-12-31 DIAGNOSIS — S8002XD Contusion of left knee, subsequent encounter: Secondary | ICD-10-CM | POA: Diagnosis not present

## 2020-01-02 DIAGNOSIS — M25562 Pain in left knee: Secondary | ICD-10-CM | POA: Diagnosis not present

## 2020-01-02 DIAGNOSIS — M9903 Segmental and somatic dysfunction of lumbar region: Secondary | ICD-10-CM | POA: Diagnosis not present

## 2020-01-02 DIAGNOSIS — M5442 Lumbago with sciatica, left side: Secondary | ICD-10-CM | POA: Diagnosis not present

## 2020-01-02 DIAGNOSIS — M9904 Segmental and somatic dysfunction of sacral region: Secondary | ICD-10-CM | POA: Diagnosis not present

## 2020-01-09 DIAGNOSIS — M9903 Segmental and somatic dysfunction of lumbar region: Secondary | ICD-10-CM | POA: Diagnosis not present

## 2020-01-09 DIAGNOSIS — M5442 Lumbago with sciatica, left side: Secondary | ICD-10-CM | POA: Diagnosis not present

## 2020-01-09 DIAGNOSIS — M25562 Pain in left knee: Secondary | ICD-10-CM | POA: Diagnosis not present

## 2020-01-09 DIAGNOSIS — M9904 Segmental and somatic dysfunction of sacral region: Secondary | ICD-10-CM | POA: Diagnosis not present

## 2020-01-14 DIAGNOSIS — M25552 Pain in left hip: Secondary | ICD-10-CM | POA: Diagnosis not present

## 2020-01-14 DIAGNOSIS — M25562 Pain in left knee: Secondary | ICD-10-CM | POA: Diagnosis not present

## 2020-01-14 DIAGNOSIS — M9904 Segmental and somatic dysfunction of sacral region: Secondary | ICD-10-CM | POA: Diagnosis not present

## 2020-01-14 DIAGNOSIS — M9903 Segmental and somatic dysfunction of lumbar region: Secondary | ICD-10-CM | POA: Diagnosis not present

## 2020-01-14 DIAGNOSIS — M5442 Lumbago with sciatica, left side: Secondary | ICD-10-CM | POA: Diagnosis not present

## 2020-01-15 DIAGNOSIS — M25552 Pain in left hip: Secondary | ICD-10-CM | POA: Diagnosis not present

## 2020-01-15 DIAGNOSIS — M25562 Pain in left knee: Secondary | ICD-10-CM | POA: Diagnosis not present

## 2020-01-20 DIAGNOSIS — M9904 Segmental and somatic dysfunction of sacral region: Secondary | ICD-10-CM | POA: Diagnosis not present

## 2020-01-20 DIAGNOSIS — M25562 Pain in left knee: Secondary | ICD-10-CM | POA: Diagnosis not present

## 2020-01-20 DIAGNOSIS — M9903 Segmental and somatic dysfunction of lumbar region: Secondary | ICD-10-CM | POA: Diagnosis not present

## 2020-01-20 DIAGNOSIS — M5442 Lumbago with sciatica, left side: Secondary | ICD-10-CM | POA: Diagnosis not present

## 2020-01-23 DIAGNOSIS — M25562 Pain in left knee: Secondary | ICD-10-CM | POA: Diagnosis not present

## 2020-01-23 DIAGNOSIS — M9904 Segmental and somatic dysfunction of sacral region: Secondary | ICD-10-CM | POA: Diagnosis not present

## 2020-01-23 DIAGNOSIS — M5442 Lumbago with sciatica, left side: Secondary | ICD-10-CM | POA: Diagnosis not present

## 2020-01-23 DIAGNOSIS — M9903 Segmental and somatic dysfunction of lumbar region: Secondary | ICD-10-CM | POA: Diagnosis not present

## 2020-02-11 DIAGNOSIS — M7062 Trochanteric bursitis, left hip: Secondary | ICD-10-CM | POA: Diagnosis not present

## 2020-02-11 DIAGNOSIS — S8002XD Contusion of left knee, subsequent encounter: Secondary | ICD-10-CM | POA: Diagnosis not present

## 2020-02-15 DIAGNOSIS — M25552 Pain in left hip: Secondary | ICD-10-CM | POA: Diagnosis not present

## 2020-02-15 DIAGNOSIS — M25562 Pain in left knee: Secondary | ICD-10-CM | POA: Diagnosis not present

## 2020-02-18 DIAGNOSIS — E039 Hypothyroidism, unspecified: Secondary | ICD-10-CM | POA: Diagnosis not present

## 2020-02-18 DIAGNOSIS — E1169 Type 2 diabetes mellitus with other specified complication: Secondary | ICD-10-CM | POA: Diagnosis not present

## 2020-02-18 DIAGNOSIS — R030 Elevated blood-pressure reading, without diagnosis of hypertension: Secondary | ICD-10-CM | POA: Diagnosis not present

## 2020-02-18 DIAGNOSIS — K219 Gastro-esophageal reflux disease without esophagitis: Secondary | ICD-10-CM | POA: Diagnosis not present

## 2020-03-16 DIAGNOSIS — M25562 Pain in left knee: Secondary | ICD-10-CM | POA: Diagnosis not present

## 2020-03-16 DIAGNOSIS — M25552 Pain in left hip: Secondary | ICD-10-CM | POA: Diagnosis not present

## 2020-03-17 DIAGNOSIS — M79605 Pain in left leg: Secondary | ICD-10-CM | POA: Diagnosis not present

## 2020-03-17 DIAGNOSIS — M545 Low back pain: Secondary | ICD-10-CM | POA: Diagnosis not present

## 2020-03-17 DIAGNOSIS — M5416 Radiculopathy, lumbar region: Secondary | ICD-10-CM | POA: Diagnosis not present

## 2020-04-15 DIAGNOSIS — M7062 Trochanteric bursitis, left hip: Secondary | ICD-10-CM | POA: Diagnosis not present

## 2020-04-16 DIAGNOSIS — M25552 Pain in left hip: Secondary | ICD-10-CM | POA: Diagnosis not present

## 2020-04-16 DIAGNOSIS — M25562 Pain in left knee: Secondary | ICD-10-CM | POA: Diagnosis not present

## 2020-04-20 DIAGNOSIS — M25562 Pain in left knee: Secondary | ICD-10-CM | POA: Diagnosis not present

## 2020-04-20 DIAGNOSIS — M7632 Iliotibial band syndrome, left leg: Secondary | ICD-10-CM | POA: Diagnosis not present

## 2020-04-20 DIAGNOSIS — M7062 Trochanteric bursitis, left hip: Secondary | ICD-10-CM | POA: Diagnosis not present

## 2020-04-20 DIAGNOSIS — M25552 Pain in left hip: Secondary | ICD-10-CM | POA: Diagnosis not present

## 2020-04-23 DIAGNOSIS — M7062 Trochanteric bursitis, left hip: Secondary | ICD-10-CM | POA: Diagnosis not present

## 2020-04-23 DIAGNOSIS — M25562 Pain in left knee: Secondary | ICD-10-CM | POA: Diagnosis not present

## 2020-04-23 DIAGNOSIS — M25552 Pain in left hip: Secondary | ICD-10-CM | POA: Diagnosis not present

## 2020-04-23 DIAGNOSIS — M7632 Iliotibial band syndrome, left leg: Secondary | ICD-10-CM | POA: Diagnosis not present

## 2020-04-29 DIAGNOSIS — M7062 Trochanteric bursitis, left hip: Secondary | ICD-10-CM | POA: Diagnosis not present

## 2020-04-29 DIAGNOSIS — M25552 Pain in left hip: Secondary | ICD-10-CM | POA: Diagnosis not present

## 2020-04-29 DIAGNOSIS — M7632 Iliotibial band syndrome, left leg: Secondary | ICD-10-CM | POA: Diagnosis not present

## 2020-04-29 DIAGNOSIS — M25562 Pain in left knee: Secondary | ICD-10-CM | POA: Diagnosis not present

## 2020-05-01 DIAGNOSIS — M25562 Pain in left knee: Secondary | ICD-10-CM | POA: Diagnosis not present

## 2020-05-01 DIAGNOSIS — M25552 Pain in left hip: Secondary | ICD-10-CM | POA: Diagnosis not present

## 2020-05-01 DIAGNOSIS — M7062 Trochanteric bursitis, left hip: Secondary | ICD-10-CM | POA: Diagnosis not present

## 2020-05-01 DIAGNOSIS — M7632 Iliotibial band syndrome, left leg: Secondary | ICD-10-CM | POA: Diagnosis not present

## 2020-05-07 DIAGNOSIS — M7062 Trochanteric bursitis, left hip: Secondary | ICD-10-CM | POA: Diagnosis not present

## 2020-05-07 DIAGNOSIS — M7632 Iliotibial band syndrome, left leg: Secondary | ICD-10-CM | POA: Diagnosis not present

## 2020-05-07 DIAGNOSIS — M25562 Pain in left knee: Secondary | ICD-10-CM | POA: Diagnosis not present

## 2020-05-07 DIAGNOSIS — M25552 Pain in left hip: Secondary | ICD-10-CM | POA: Diagnosis not present

## 2020-05-12 DIAGNOSIS — Z1231 Encounter for screening mammogram for malignant neoplasm of breast: Secondary | ICD-10-CM | POA: Diagnosis not present

## 2020-05-15 DIAGNOSIS — M25552 Pain in left hip: Secondary | ICD-10-CM | POA: Diagnosis not present

## 2020-05-15 DIAGNOSIS — S8002XD Contusion of left knee, subsequent encounter: Secondary | ICD-10-CM | POA: Diagnosis not present

## 2020-05-16 DIAGNOSIS — M25562 Pain in left knee: Secondary | ICD-10-CM | POA: Diagnosis not present

## 2020-05-16 DIAGNOSIS — M25552 Pain in left hip: Secondary | ICD-10-CM | POA: Diagnosis not present

## 2020-05-28 DIAGNOSIS — M7062 Trochanteric bursitis, left hip: Secondary | ICD-10-CM | POA: Diagnosis not present

## 2020-06-04 DIAGNOSIS — M24152 Other articular cartilage disorders, left hip: Secondary | ICD-10-CM | POA: Diagnosis not present

## 2020-06-04 DIAGNOSIS — E669 Obesity, unspecified: Secondary | ICD-10-CM | POA: Diagnosis not present

## 2020-06-04 DIAGNOSIS — M7062 Trochanteric bursitis, left hip: Secondary | ICD-10-CM | POA: Diagnosis not present

## 2020-06-04 DIAGNOSIS — E1169 Type 2 diabetes mellitus with other specified complication: Secondary | ICD-10-CM | POA: Diagnosis not present

## 2020-06-04 DIAGNOSIS — E039 Hypothyroidism, unspecified: Secondary | ICD-10-CM | POA: Diagnosis not present

## 2020-06-10 DIAGNOSIS — M25552 Pain in left hip: Secondary | ICD-10-CM | POA: Diagnosis not present

## 2020-06-15 DIAGNOSIS — E039 Hypothyroidism, unspecified: Secondary | ICD-10-CM | POA: Diagnosis not present

## 2020-06-15 DIAGNOSIS — B027 Disseminated zoster: Secondary | ICD-10-CM | POA: Diagnosis not present

## 2020-06-17 DIAGNOSIS — L308 Other specified dermatitis: Secondary | ICD-10-CM | POA: Diagnosis not present

## 2020-06-17 DIAGNOSIS — L0889 Other specified local infections of the skin and subcutaneous tissue: Secondary | ICD-10-CM | POA: Diagnosis not present

## 2020-06-17 DIAGNOSIS — R21 Rash and other nonspecific skin eruption: Secondary | ICD-10-CM | POA: Diagnosis not present

## 2020-06-24 DIAGNOSIS — M24152 Other articular cartilage disorders, left hip: Secondary | ICD-10-CM | POA: Diagnosis not present

## 2020-06-24 DIAGNOSIS — M7062 Trochanteric bursitis, left hip: Secondary | ICD-10-CM | POA: Diagnosis not present

## 2020-06-29 DIAGNOSIS — Z1152 Encounter for screening for COVID-19: Secondary | ICD-10-CM | POA: Diagnosis not present

## 2020-09-23 DIAGNOSIS — M76892 Other specified enthesopathies of left lower limb, excluding foot: Secondary | ICD-10-CM | POA: Diagnosis not present

## 2020-09-23 DIAGNOSIS — M25552 Pain in left hip: Secondary | ICD-10-CM | POA: Diagnosis not present

## 2020-09-28 DIAGNOSIS — H5203 Hypermetropia, bilateral: Secondary | ICD-10-CM | POA: Diagnosis not present

## 2020-10-06 DIAGNOSIS — E1169 Type 2 diabetes mellitus with other specified complication: Secondary | ICD-10-CM | POA: Diagnosis not present

## 2020-10-06 DIAGNOSIS — E785 Hyperlipidemia, unspecified: Secondary | ICD-10-CM | POA: Diagnosis not present

## 2020-10-06 DIAGNOSIS — E039 Hypothyroidism, unspecified: Secondary | ICD-10-CM | POA: Diagnosis not present

## 2020-10-06 DIAGNOSIS — R82998 Other abnormal findings in urine: Secondary | ICD-10-CM | POA: Diagnosis not present

## 2021-03-02 ENCOUNTER — Ambulatory Visit: Payer: No Typology Code available for payment source | Attending: Surgery

## 2021-03-02 DIAGNOSIS — Z20822 Contact with and (suspected) exposure to covid-19: Secondary | ICD-10-CM | POA: Insufficient documentation

## 2021-03-03 LAB — NOVEL CORONAVIRUS, NAA: SARS-CoV-2, NAA: NOT DETECTED

## 2022-10-12 ENCOUNTER — Other Ambulatory Visit: Payer: Self-pay | Admitting: Obstetrics and Gynecology

## 2022-10-12 DIAGNOSIS — R928 Other abnormal and inconclusive findings on diagnostic imaging of breast: Secondary | ICD-10-CM

## 2022-10-28 ENCOUNTER — Ambulatory Visit
Admission: RE | Admit: 2022-10-28 | Discharge: 2022-10-28 | Disposition: A | Discharge status: Home / Self Care | Payer: No Typology Code available for payment source | Source: Ambulatory Visit | Attending: Obstetrics and Gynecology | Admitting: Obstetrics and Gynecology

## 2022-10-28 ENCOUNTER — Other Ambulatory Visit: Payer: Self-pay | Admitting: Obstetrics and Gynecology

## 2022-10-28 DIAGNOSIS — N6310 Unspecified lump in the right breast, unspecified quadrant: Secondary | ICD-10-CM

## 2022-10-28 DIAGNOSIS — R928 Other abnormal and inconclusive findings on diagnostic imaging of breast: Secondary | ICD-10-CM

## 2023-01-23 DIAGNOSIS — E1169 Type 2 diabetes mellitus with other specified complication: Secondary | ICD-10-CM | POA: Diagnosis not present

## 2023-01-23 DIAGNOSIS — E039 Hypothyroidism, unspecified: Secondary | ICD-10-CM | POA: Diagnosis not present

## 2023-01-23 DIAGNOSIS — I10 Essential (primary) hypertension: Secondary | ICD-10-CM | POA: Diagnosis not present

## 2023-01-23 DIAGNOSIS — E669 Obesity, unspecified: Secondary | ICD-10-CM | POA: Diagnosis not present

## 2023-01-23 DIAGNOSIS — E785 Hyperlipidemia, unspecified: Secondary | ICD-10-CM | POA: Diagnosis not present

## 2023-03-14 DIAGNOSIS — Z01419 Encounter for gynecological examination (general) (routine) without abnormal findings: Secondary | ICD-10-CM | POA: Diagnosis not present

## 2023-03-14 DIAGNOSIS — Z130 Encounter for screening for diseases of the blood and blood-forming organs and certain disorders involving the immune mechanism: Secondary | ICD-10-CM | POA: Diagnosis not present

## 2023-03-14 DIAGNOSIS — D259 Leiomyoma of uterus, unspecified: Secondary | ICD-10-CM | POA: Diagnosis not present

## 2023-03-14 DIAGNOSIS — Z124 Encounter for screening for malignant neoplasm of cervix: Secondary | ICD-10-CM | POA: Diagnosis not present

## 2023-03-14 DIAGNOSIS — Z1151 Encounter for screening for human papillomavirus (HPV): Secondary | ICD-10-CM | POA: Diagnosis not present

## 2023-03-14 DIAGNOSIS — N951 Menopausal and female climacteric states: Secondary | ICD-10-CM | POA: Diagnosis not present

## 2023-03-14 DIAGNOSIS — N3941 Urge incontinence: Secondary | ICD-10-CM | POA: Diagnosis not present

## 2023-04-20 ENCOUNTER — Ambulatory Visit
Admission: RE | Admit: 2023-04-20 | Discharge: 2023-04-20 | Disposition: A | Discharge status: Home / Self Care | Payer: BC Managed Care – PPO | Source: Ambulatory Visit | Attending: Obstetrics and Gynecology | Admitting: Obstetrics and Gynecology

## 2023-04-20 ENCOUNTER — Ambulatory Visit
Admission: RE | Admit: 2023-04-20 | Discharge: 2023-04-20 | Disposition: A | Discharge status: Home / Self Care | Payer: No Typology Code available for payment source | Source: Ambulatory Visit | Attending: Obstetrics and Gynecology | Admitting: Obstetrics and Gynecology

## 2023-04-20 DIAGNOSIS — N6310 Unspecified lump in the right breast, unspecified quadrant: Secondary | ICD-10-CM

## 2023-04-20 DIAGNOSIS — N6311 Unspecified lump in the right breast, upper outer quadrant: Secondary | ICD-10-CM | POA: Diagnosis not present

## 2023-04-20 DIAGNOSIS — R921 Mammographic calcification found on diagnostic imaging of breast: Secondary | ICD-10-CM | POA: Diagnosis not present

## 2023-04-20 DIAGNOSIS — R928 Other abnormal and inconclusive findings on diagnostic imaging of breast: Secondary | ICD-10-CM

## 2023-05-09 DIAGNOSIS — E1169 Type 2 diabetes mellitus with other specified complication: Secondary | ICD-10-CM | POA: Diagnosis not present

## 2023-05-09 DIAGNOSIS — E039 Hypothyroidism, unspecified: Secondary | ICD-10-CM | POA: Diagnosis not present

## 2023-05-09 DIAGNOSIS — I10 Essential (primary) hypertension: Secondary | ICD-10-CM | POA: Diagnosis not present

## 2023-09-05 DIAGNOSIS — E039 Hypothyroidism, unspecified: Secondary | ICD-10-CM | POA: Diagnosis not present

## 2023-09-05 DIAGNOSIS — I10 Essential (primary) hypertension: Secondary | ICD-10-CM | POA: Diagnosis not present

## 2023-09-05 DIAGNOSIS — E1169 Type 2 diabetes mellitus with other specified complication: Secondary | ICD-10-CM | POA: Diagnosis not present

## 2023-09-08 DIAGNOSIS — H52203 Unspecified astigmatism, bilateral: Secondary | ICD-10-CM | POA: Diagnosis not present

## 2023-09-08 DIAGNOSIS — E119 Type 2 diabetes mellitus without complications: Secondary | ICD-10-CM | POA: Diagnosis not present

## 2023-09-08 DIAGNOSIS — H2513 Age-related nuclear cataract, bilateral: Secondary | ICD-10-CM | POA: Diagnosis not present

## 2023-09-08 DIAGNOSIS — H5203 Hypermetropia, bilateral: Secondary | ICD-10-CM | POA: Diagnosis not present

## 2023-09-12 DIAGNOSIS — R82998 Other abnormal findings in urine: Secondary | ICD-10-CM | POA: Diagnosis not present

## 2023-09-12 DIAGNOSIS — Z1331 Encounter for screening for depression: Secondary | ICD-10-CM | POA: Diagnosis not present

## 2023-09-12 DIAGNOSIS — E1169 Type 2 diabetes mellitus with other specified complication: Secondary | ICD-10-CM | POA: Diagnosis not present

## 2023-09-12 DIAGNOSIS — Z23 Encounter for immunization: Secondary | ICD-10-CM | POA: Diagnosis not present

## 2023-09-12 DIAGNOSIS — Z1339 Encounter for screening examination for other mental health and behavioral disorders: Secondary | ICD-10-CM | POA: Diagnosis not present

## 2023-09-12 DIAGNOSIS — Z0000 Encounter for general adult medical examination without abnormal findings: Secondary | ICD-10-CM | POA: Diagnosis not present

## 2023-09-12 DIAGNOSIS — I10 Essential (primary) hypertension: Secondary | ICD-10-CM | POA: Diagnosis not present

## 2023-10-15 DIAGNOSIS — R0981 Nasal congestion: Secondary | ICD-10-CM | POA: Diagnosis not present

## 2023-10-15 DIAGNOSIS — R059 Cough, unspecified: Secondary | ICD-10-CM | POA: Diagnosis not present

## 2023-10-15 DIAGNOSIS — H9201 Otalgia, right ear: Secondary | ICD-10-CM | POA: Diagnosis not present

## 2023-11-14 DIAGNOSIS — E039 Hypothyroidism, unspecified: Secondary | ICD-10-CM | POA: Diagnosis not present

## 2023-11-14 DIAGNOSIS — Z1211 Encounter for screening for malignant neoplasm of colon: Secondary | ICD-10-CM | POA: Diagnosis not present

## 2023-11-14 DIAGNOSIS — R1310 Dysphagia, unspecified: Secondary | ICD-10-CM | POA: Diagnosis not present

## 2023-11-14 DIAGNOSIS — K5909 Other constipation: Secondary | ICD-10-CM | POA: Diagnosis not present

## 2024-01-05 DIAGNOSIS — Z1211 Encounter for screening for malignant neoplasm of colon: Secondary | ICD-10-CM | POA: Diagnosis not present

## 2024-01-05 DIAGNOSIS — Z860102 Personal history of hyperplastic colon polyps: Secondary | ICD-10-CM | POA: Diagnosis not present

## 2024-01-05 DIAGNOSIS — K5520 Angiodysplasia of colon without hemorrhage: Secondary | ICD-10-CM | POA: Diagnosis not present

## 2024-01-05 DIAGNOSIS — D175 Benign lipomatous neoplasm of intra-abdominal organs: Secondary | ICD-10-CM | POA: Diagnosis not present

## 2024-01-25 DIAGNOSIS — E1169 Type 2 diabetes mellitus with other specified complication: Secondary | ICD-10-CM | POA: Diagnosis not present

## 2024-01-25 DIAGNOSIS — I10 Essential (primary) hypertension: Secondary | ICD-10-CM | POA: Diagnosis not present

## 2024-03-14 DIAGNOSIS — Z01419 Encounter for gynecological examination (general) (routine) without abnormal findings: Secondary | ICD-10-CM | POA: Diagnosis not present

## 2024-03-15 ENCOUNTER — Other Ambulatory Visit: Payer: Self-pay | Admitting: Obstetrics and Gynecology

## 2024-03-15 DIAGNOSIS — R5381 Other malaise: Secondary | ICD-10-CM

## 2024-03-15 DIAGNOSIS — N6310 Unspecified lump in the right breast, unspecified quadrant: Secondary | ICD-10-CM

## 2024-03-29 DIAGNOSIS — N8500 Endometrial hyperplasia, unspecified: Secondary | ICD-10-CM | POA: Diagnosis not present

## 2024-04-22 ENCOUNTER — Ambulatory Visit

## 2024-04-22 ENCOUNTER — Ambulatory Visit
Admission: RE | Admit: 2024-04-22 | Discharge: 2024-04-22 | Disposition: A | Discharge status: Home / Self Care | Source: Ambulatory Visit | Attending: Obstetrics and Gynecology | Admitting: Obstetrics and Gynecology

## 2024-04-22 DIAGNOSIS — R928 Other abnormal and inconclusive findings on diagnostic imaging of breast: Secondary | ICD-10-CM | POA: Diagnosis not present

## 2024-04-22 DIAGNOSIS — N6310 Unspecified lump in the right breast, unspecified quadrant: Secondary | ICD-10-CM

## 2024-05-22 DIAGNOSIS — I10 Essential (primary) hypertension: Secondary | ICD-10-CM | POA: Diagnosis not present

## 2024-05-22 DIAGNOSIS — E1169 Type 2 diabetes mellitus with other specified complication: Secondary | ICD-10-CM | POA: Diagnosis not present

## 2024-09-10 DIAGNOSIS — E119 Type 2 diabetes mellitus without complications: Secondary | ICD-10-CM | POA: Diagnosis not present

## 2024-09-10 DIAGNOSIS — H5203 Hypermetropia, bilateral: Secondary | ICD-10-CM | POA: Diagnosis not present

## 2024-09-10 DIAGNOSIS — H04123 Dry eye syndrome of bilateral lacrimal glands: Secondary | ICD-10-CM | POA: Diagnosis not present

## 2024-09-10 DIAGNOSIS — H2513 Age-related nuclear cataract, bilateral: Secondary | ICD-10-CM | POA: Diagnosis not present

## 2024-09-10 DIAGNOSIS — H52203 Unspecified astigmatism, bilateral: Secondary | ICD-10-CM | POA: Diagnosis not present

## 2024-09-16 DIAGNOSIS — I10 Essential (primary) hypertension: Secondary | ICD-10-CM | POA: Diagnosis not present

## 2024-09-16 DIAGNOSIS — E785 Hyperlipidemia, unspecified: Secondary | ICD-10-CM | POA: Diagnosis not present

## 2024-09-16 DIAGNOSIS — Z23 Encounter for immunization: Secondary | ICD-10-CM | POA: Diagnosis not present

## 2024-09-16 DIAGNOSIS — E039 Hypothyroidism, unspecified: Secondary | ICD-10-CM | POA: Diagnosis not present

## 2024-09-16 DIAGNOSIS — Z0000 Encounter for general adult medical examination without abnormal findings: Secondary | ICD-10-CM | POA: Diagnosis not present

## 2024-09-16 DIAGNOSIS — E1169 Type 2 diabetes mellitus with other specified complication: Secondary | ICD-10-CM | POA: Diagnosis not present
